# Patient Record
Sex: Female | Born: 1955 | Race: White | Hispanic: No | Marital: Married | Smoking: Current every day smoker
Health system: Southern US, Community
[De-identification: ages and names within clinical notes are randomized; demographics above are authoritative.]

## PROBLEM LIST (undated history)

## (undated) DIAGNOSIS — Z950 Presence of cardiac pacemaker: Secondary | ICD-10-CM

## (undated) DIAGNOSIS — E079 Disorder of thyroid, unspecified: Secondary | ICD-10-CM

## (undated) DIAGNOSIS — M329 Systemic lupus erythematosus, unspecified: Secondary | ICD-10-CM

## (undated) DIAGNOSIS — M797 Fibromyalgia: Secondary | ICD-10-CM

## (undated) DIAGNOSIS — S72009A Fracture of unspecified part of neck of unspecified femur, initial encounter for closed fracture: Secondary | ICD-10-CM

## (undated) DIAGNOSIS — F0390 Unspecified dementia without behavioral disturbance: Secondary | ICD-10-CM

## (undated) DIAGNOSIS — I2510 Atherosclerotic heart disease of native coronary artery without angina pectoris: Secondary | ICD-10-CM

## (undated) DIAGNOSIS — N289 Disorder of kidney and ureter, unspecified: Secondary | ICD-10-CM

## (undated) DIAGNOSIS — E119 Type 2 diabetes mellitus without complications: Secondary | ICD-10-CM

## (undated) DIAGNOSIS — J449 Chronic obstructive pulmonary disease, unspecified: Secondary | ICD-10-CM

## (undated) DIAGNOSIS — I509 Heart failure, unspecified: Secondary | ICD-10-CM

## (undated) DIAGNOSIS — I639 Cerebral infarction, unspecified: Secondary | ICD-10-CM

## (undated) DIAGNOSIS — IMO0002 Reserved for concepts with insufficient information to code with codable children: Secondary | ICD-10-CM

## (undated) DIAGNOSIS — I10 Essential (primary) hypertension: Secondary | ICD-10-CM

## (undated) HISTORY — PX: CHOLECYSTECTOMY: SHX55

## (undated) HISTORY — PX: CARDIAC DEFIBRILLATOR PLACEMENT: SHX171

## (undated) HISTORY — PX: SHOULDER SURGERY: SHX246

## (undated) HISTORY — PX: CORONARY ANGIOPLASTY WITH STENT PLACEMENT: SHX49

## (undated) HISTORY — PX: APPENDECTOMY: SHX54

## (undated) HISTORY — PX: ICD GENERATOR CHANGE: SHX5854

## (undated) HISTORY — PX: ABDOMINAL HYSTERECTOMY: SHX81

---

## 2000-05-13 ENCOUNTER — Ambulatory Visit (HOSPITAL_BASED_OUTPATIENT_CLINIC_OR_DEPARTMENT_OTHER): Admission: RE | Admit: 2000-05-13 | Discharge: 2000-05-13 | Payer: Self-pay | Admitting: Otolaryngology

## 2013-12-03 ENCOUNTER — Other Ambulatory Visit (HOSPITAL_BASED_OUTPATIENT_CLINIC_OR_DEPARTMENT_OTHER): Payer: Self-pay | Admitting: Podiatry

## 2013-12-03 DIAGNOSIS — M25472 Effusion, left ankle: Secondary | ICD-10-CM

## 2013-12-03 DIAGNOSIS — M779 Enthesopathy, unspecified: Secondary | ICD-10-CM

## 2013-12-03 DIAGNOSIS — R52 Pain, unspecified: Secondary | ICD-10-CM

## 2013-12-03 DIAGNOSIS — M7672 Peroneal tendinitis, left leg: Secondary | ICD-10-CM

## 2013-12-04 ENCOUNTER — Other Ambulatory Visit (HOSPITAL_BASED_OUTPATIENT_CLINIC_OR_DEPARTMENT_OTHER): Payer: Self-pay

## 2013-12-04 ENCOUNTER — Ambulatory Visit (HOSPITAL_BASED_OUTPATIENT_CLINIC_OR_DEPARTMENT_OTHER): Payer: Medicare Other

## 2013-12-07 ENCOUNTER — Other Ambulatory Visit (HOSPITAL_BASED_OUTPATIENT_CLINIC_OR_DEPARTMENT_OTHER): Payer: Self-pay | Admitting: Podiatry

## 2013-12-07 ENCOUNTER — Ambulatory Visit (HOSPITAL_BASED_OUTPATIENT_CLINIC_OR_DEPARTMENT_OTHER)
Admission: RE | Admit: 2013-12-07 | Discharge: 2013-12-07 | Disposition: A | Discharge status: Home / Self Care | Payer: Medicare Other | Source: Ambulatory Visit | Attending: Podiatry | Admitting: Podiatry

## 2013-12-07 DIAGNOSIS — M7730 Calcaneal spur, unspecified foot: Secondary | ICD-10-CM | POA: Insufficient documentation

## 2013-12-07 DIAGNOSIS — M8430XA Stress fracture, unspecified site, initial encounter for fracture: Secondary | ICD-10-CM | POA: Insufficient documentation

## 2016-01-15 ENCOUNTER — Emergency Department (HOSPITAL_BASED_OUTPATIENT_CLINIC_OR_DEPARTMENT_OTHER): Payer: Medicare Other

## 2016-01-15 ENCOUNTER — Encounter (HOSPITAL_BASED_OUTPATIENT_CLINIC_OR_DEPARTMENT_OTHER): Payer: Self-pay | Admitting: Emergency Medicine

## 2016-01-15 DIAGNOSIS — Z8673 Personal history of transient ischemic attack (TIA), and cerebral infarction without residual deficits: Secondary | ICD-10-CM | POA: Insufficient documentation

## 2016-01-15 DIAGNOSIS — I509 Heart failure, unspecified: Secondary | ICD-10-CM | POA: Diagnosis not present

## 2016-01-15 DIAGNOSIS — Z7982 Long term (current) use of aspirin: Secondary | ICD-10-CM | POA: Diagnosis not present

## 2016-01-15 DIAGNOSIS — Z7951 Long term (current) use of inhaled steroids: Secondary | ICD-10-CM | POA: Diagnosis not present

## 2016-01-15 DIAGNOSIS — S61214A Laceration without foreign body of right ring finger without damage to nail, initial encounter: Secondary | ICD-10-CM | POA: Insufficient documentation

## 2016-01-15 DIAGNOSIS — Y999 Unspecified external cause status: Secondary | ICD-10-CM | POA: Insufficient documentation

## 2016-01-15 DIAGNOSIS — Y929 Unspecified place or not applicable: Secondary | ICD-10-CM | POA: Diagnosis not present

## 2016-01-15 DIAGNOSIS — J449 Chronic obstructive pulmonary disease, unspecified: Secondary | ICD-10-CM | POA: Insufficient documentation

## 2016-01-15 DIAGNOSIS — W268XXA Contact with other sharp object(s), not elsewhere classified, initial encounter: Secondary | ICD-10-CM | POA: Diagnosis not present

## 2016-01-15 DIAGNOSIS — Z79899 Other long term (current) drug therapy: Secondary | ICD-10-CM | POA: Diagnosis not present

## 2016-01-15 DIAGNOSIS — I110 Hypertensive heart disease with heart failure: Secondary | ICD-10-CM | POA: Diagnosis not present

## 2016-01-15 DIAGNOSIS — Y939 Activity, unspecified: Secondary | ICD-10-CM | POA: Diagnosis not present

## 2016-01-15 DIAGNOSIS — I2510 Atherosclerotic heart disease of native coronary artery without angina pectoris: Secondary | ICD-10-CM | POA: Diagnosis not present

## 2016-01-15 DIAGNOSIS — F17200 Nicotine dependence, unspecified, uncomplicated: Secondary | ICD-10-CM | POA: Insufficient documentation

## 2016-01-15 NOTE — ED Triage Notes (Signed)
Patient states that she cut her right ring finger  - patient has laceration   To the right finger. Bleeding controlled

## 2016-01-16 ENCOUNTER — Encounter (HOSPITAL_BASED_OUTPATIENT_CLINIC_OR_DEPARTMENT_OTHER): Payer: Self-pay | Admitting: Emergency Medicine

## 2016-01-16 ENCOUNTER — Emergency Department (HOSPITAL_BASED_OUTPATIENT_CLINIC_OR_DEPARTMENT_OTHER)
Admission: EM | Admit: 2016-01-16 | Discharge: 2016-01-16 | Disposition: A | Discharge status: Home / Self Care | Payer: Medicare Other | Attending: Emergency Medicine | Admitting: Emergency Medicine

## 2016-01-16 DIAGNOSIS — S61214A Laceration without foreign body of right ring finger without damage to nail, initial encounter: Secondary | ICD-10-CM | POA: Diagnosis not present

## 2016-01-16 DIAGNOSIS — S61219A Laceration without foreign body of unspecified finger without damage to nail, initial encounter: Secondary | ICD-10-CM

## 2016-01-16 HISTORY — DX: Type 2 diabetes mellitus without complications: E11.9

## 2016-01-16 HISTORY — DX: Fibromyalgia: M79.7

## 2016-01-16 HISTORY — DX: Presence of cardiac pacemaker: Z95.0

## 2016-01-16 HISTORY — DX: Cerebral infarction, unspecified: I63.9

## 2016-01-16 HISTORY — DX: Disorder of thyroid, unspecified: E07.9

## 2016-01-16 HISTORY — DX: Reserved for concepts with insufficient information to code with codable children: IMO0002

## 2016-01-16 HISTORY — DX: Fracture of unspecified part of neck of unspecified femur, initial encounter for closed fracture: S72.009A

## 2016-01-16 HISTORY — DX: Essential (primary) hypertension: I10

## 2016-01-16 HISTORY — DX: Heart failure, unspecified: I50.9

## 2016-01-16 HISTORY — DX: Disorder of kidney and ureter, unspecified: N28.9

## 2016-01-16 HISTORY — DX: Systemic lupus erythematosus, unspecified: M32.9

## 2016-01-16 HISTORY — DX: Chronic obstructive pulmonary disease, unspecified: J44.9

## 2016-01-16 HISTORY — DX: Unspecified dementia, unspecified severity, without behavioral disturbance, psychotic disturbance, mood disturbance, and anxiety: F03.90

## 2016-01-16 HISTORY — DX: Atherosclerotic heart disease of native coronary artery without angina pectoris: I25.10

## 2016-01-16 MED ORDER — CIPROFLOXACIN HCL 500 MG PO TABS
500.0000 mg | ORAL_TABLET | Freq: Once | ORAL | Status: AC
  Administered 2016-01-16: 500 mg via ORAL
  Filled 2016-01-16: qty 1

## 2016-01-16 MED ORDER — LIDOCAINE VISCOUS 2 % MT SOLN: 15.0000 mL | Freq: Once | OROMUCOSAL | Status: DC

## 2016-01-16 MED ORDER — LIDOCAINE HCL 2 % IJ SOLN
10.0000 mL | Freq: Once | INTRAMUSCULAR | Status: AC
  Administered 2016-01-16: 400 mg via INTRADERMAL
  Filled 2016-01-16: qty 20

## 2016-01-16 MED ORDER — CIPROFLOXACIN HCL 500 MG PO TABS: 500.0000 mg | ORAL_TABLET | Freq: Two times a day (BID) | ORAL | 0 refills | Status: AC

## 2016-01-16 NOTE — ED Notes (Signed)
Cut rt ring finger on blass  Bleeding controlled,  Pt states unable to straighten  finger

## 2016-01-16 NOTE — ED Provider Notes (Addendum)
[facility]   CSN: [id number] Arrival date & time: 01/15/16  2254  First Provider Contact:  First MD Initiated Contact with Patient 01/16/16 0217        History   Chief Complaint Chief Complaint  Patient presents with  . Laceration    HPI Abigail Pearson is a 60 y.o. female.  The history is provided by the patient and a relative.  Laceration   The incident occurred 3 to 5 hours ago. The laceration is located on the right hand. Size: 1.3 cm. The laceration mechanism was a a metal edge. The pain is mild. The pain has been constant since onset. She reports no foreign bodies present. Her tetanus status is UTD.  jJammed finger in a glass and cut finger on brillo pad.    Past Medical History:  Diagnosis Date  . CHF (congestive heart failure) (HCC)   . COPD (chronic obstructive pulmonary disease) (HCC)   . Coronary artery disease   . Dementia   . Diabetes mellitus without complication (HCC)   . Fibromyalgia   . Hip fracture (HCC)   . Hypertension   . Lupus (HCC)   . Pacemaker   . Renal disorder   . Stroke (HCC)   . Thyroid disease     There are no active problems to display for this patient.   Past Surgical History:  Procedure Laterality Date  . ABDOMINAL HYSTERECTOMY    . APPENDECTOMY    . CARDIAC DEFIBRILLATOR PLACEMENT    . CHOLECYSTECTOMY    . CORONARY ANGIOPLASTY WITH STENT PLACEMENT    . ICD GENERATOR CHANGE    . SHOULDER SURGERY      OB History    No data available       Home Medications    Prior to Admission medications   Medication Sig Start Date End Date Taking? Authorizing Provider  albuterol (PROVENTIL HFA;VENTOLIN HFA) 108 (90 Base) MCG/ACT inhaler Inhale into the lungs every 6 (six) hours as needed for wheezing or shortness of breath.   Yes Historical Provider, MD  ALPRAZolam Prudy Feeler) 0.5 MG tablet Take 0.5 mg by mouth 3 (three) times daily.   Yes Historical Provider, MD  amphetamine-dextroamphetamine (ADDERALL) 10 MG  tablet Take 10 mg by mouth daily with breakfast.   Yes Historical Provider, MD  aspirin 325 MG tablet Take 325 mg by mouth daily.   Yes Historical Provider, MD  eszopiclone (LUNESTA) 1 MG TABS tablet Take 1 mg by mouth at bedtime as needed for sleep. Take immediately before bedtime   Yes Historical Provider, MD  HYDROcodone-acetaminophen (NORCO/VICODIN) 5-325 MG tablet Take 2 tablets by mouth every 6 (six) hours as needed for moderate pain.   Yes Historical Provider, MD  isosorbide mononitrate (IMDUR) 120 MG 24 hr tablet Take 120 mg by mouth daily.   Yes Historical Provider, MD  levothyroxine (SYNTHROID, LEVOTHROID) 137 MCG tablet Take 137 mcg by mouth daily before breakfast.   Yes Historical Provider, MD  lisinopril (PRINIVIL,ZESTRIL) 20 MG tablet Take 20 mg by mouth daily.   Yes Historical Provider, MD  memantine (NAMENDA) 10 MG tablet Take 20 mg by mouth daily.   Yes Historical Provider, MD  metoprolol succinate (TOPROL-XL) 50 MG 24 hr tablet Take 50 mg by mouth daily. Take with or immediately following a meal.   Yes Historical Provider, MD  mirtazapine (REMERON) 30 MG tablet Take 30 mg by mouth at bedtime.   Yes Historical Provider, MD  oxycodone (OXY-IR) 5 MG capsule Take  10 mg by mouth every 4 (four) hours as needed.   Yes Historical Provider, MD  QUEtiapine (SEROQUEL) 50 MG tablet Take 50 mg by mouth at bedtime.   Yes Historical Provider, MD    Family History History reviewed. No pertinent family history.  Social History Social History  Substance Use Topics  . Smoking status: Current Every Day Smoker  . Smokeless tobacco: Never Used  . Alcohol use No     Allergies   Lithium   Review of Systems Review of Systems  Musculoskeletal: Positive for arthralgias.  Skin: Positive for wound.  All other systems reviewed and are negative.    Physical Exam Updated Vital Signs BP 147/55 (BP Location: Left Arm)   Pulse 84   Temp 97.7 F (36.5 C) (Oral)   Resp 16   Ht  (1.702  m)   Wt 124 lb (56.2 kg)   SpO2 100%   BMI 19.42 kg/m   Physical Exam  Constitutional: She appears well-developed and well-nourished. No distress.  HENT:  Head: Normocephalic and atraumatic.  Mouth/Throat: No oropharyngeal exudate.  Eyes: EOM are normal. Pupils are equal, round, and reactive to light.  Neck: Normal range of motion. Neck supple.  Cardiovascular: Normal rate, regular rhythm and intact distal pulses.   Pulmonary/Chest: Effort normal and breath sounds normal.  Abdominal: Soft. Bowel sounds are normal. There is no tenderness.  Musculoskeletal: Normal range of motion.  Skin: Skin is warm. Capillary refill takes less than 2 seconds.     Psychiatric: She has a normal mood and affect.     ED Treatments / Results  Labs (all labs ordered are listed, but only abnormal results are displayed) Labs Reviewed - No data to display  EKG  EKG Interpretation None       Radiology Dg Finger Ring Right  Result Date: 01/16/2016 CLINICAL DATA:  Cut ring finger 2 hours ago at DIP. EXAM: RIGHT RING FINGER 2+V COMPARISON:  None. FINDINGS: There is no evidence of fracture or dislocation. Persistently flexed fourth DIP. There is no evidence of arthropathy or other focal bone abnormality. Soft tissues are normal; bandage about the proximal fourth finger. IMPRESSION: No acute fracture deformity dislocation. Flexed fourth DIP joint associated with ligamentous injury. Electronically Signed   By: Awilda Metro M.D.   On: 01/16/2016 00:30    Procedures .Marland KitchenLaceration Repair Date/Time: 01/16/2016 3:40 AM Performed by: Cy Blamer Authorized by: Cy Blamer   Consent:    Consent obtained:  Verbal   Consent given by:  Patient   Risks discussed:  Infection, pain and poor wound healing Anesthesia (see MAR for exact dosages):    Anesthesia method:  Nerve block   Block needle gauge:  25 G   Block anesthetic:  Lidocaine 2% w/o epi   Block injection procedure:  Anatomic landmarks  identified and incremental injection   Block outcome:  Anesthesia achieved Laceration details:    Location: finger ring right.   Length (cm):  1.3   Depth (mm):  1 Repair type:    Repair type:  Simple Pre-procedure details:    Preparation:  Patient was prepped and draped in usual sterile fashion Exploration:    Wound exploration: wound explored through full range of motion     Wound extent: no nerve damage noted, no tendon damage noted and no underlying fracture noted     Contaminated: no   Treatment:    Area cleansed with:  Betadine and saline   Amount of cleaning:  Extensive  Irrigation solution:  Sterile saline Skin repair:    Repair method:  Sutures   Suture size:  4-0   Suture material:  Nylon   Suture technique:  Simple interrupted   Number of sutures:  3 Approximation:    Approximation:  Close   Vermilion border: well-aligned   Post-procedure details:    Dressing:  Bulky dressing and sterile dressing   Patient tolerance of procedure:  Tolerated well, no immediate complications Comments:     Removal in 8 days, cipro as is a diabetic   (including critical care time)  Medications Ordered in ED Medications  lidocaine (XYLOCAINE) 2 % (with pres) injection 200 mg (400 mg Intradermal Given 01/16/16 0303)     Initial Impression / Assessment and Plan / ED Course  I have reviewed the triage vital signs and the nursing notes.  Pertinent labs & imaging results that were available during my care of the patient were reviewed by me and considered in my medical decision making (see chart for details).  Clinical Course    No results found for this or any previous visit. Dg Finger Ring Right  Result Date: 01/16/2016 CLINICAL DATA:  Cut ring finger 2 hours ago at DIP. EXAM: RIGHT RING FINGER 2+V COMPARISON:  None. FINDINGS: There is no evidence of fracture or dislocation. Persistently flexed fourth DIP. There is no evidence of arthropathy or other focal bone abnormality. Soft  tissues are normal; bandage about the proximal fourth finger. IMPRESSION: No acute fracture deformity dislocation. Flexed fourth DIP joint associated with ligamentous injury. Electronically Signed   By: Awilda Metroourtnay  Bloomer M.D.   On: 01/16/2016 00:30     Final Clinical Impressions(s) / ED Diagnoses   Final diagnoses:  None   Vitals:   01/15/16 2314 01/16/16 0145  BP: 143/69 147/55  Pulse: 94 84  Resp: 16 16  Temp: 97.7 F (36.5 C)    Medications  lidocaine (XYLOCAINE) 2 % (with pres) injection 200 mg (400 mg Intradermal Given 01/16/16 0303)  ciprofloxacin (CIPRO) tablet 500 mg (500 mg Oral Given 01/16/16 0402)   Have splinted finger.  Reduced x 5 and finger straightens and then DIP flexes again.  Follow up with hand surgery.  Leave in splint.  Suture removal at urgent care in 8 days.  Abx given as patient is a diabetic. All questions answered to patient's satisfaction. Based on history and exam patient has been appropriately medically screened and emergency conditions excluded. Patient is stable for discharge at this time. Follow up with your PMDfor recheck in 2 daysand strict return precautions given. New Prescriptions New Prescriptions   No medications on file     Izadora Roehr, MD 01/16/16 [id number]    Niamh Rada, MD 01/16/16 0750    Zameer Borman, MD 01/16/16 [phone]

## 2017-04-03 IMAGING — CR DG FINGER RING 2+V*R*
3 series · 3 of 3 positions shown · non-contrast
Comparison: None.

CLINICAL DATA: Cut ring finger 2 hours ago at DIP.

EXAM:
RIGHT RING FINGER 2+V

[x finger pa right]
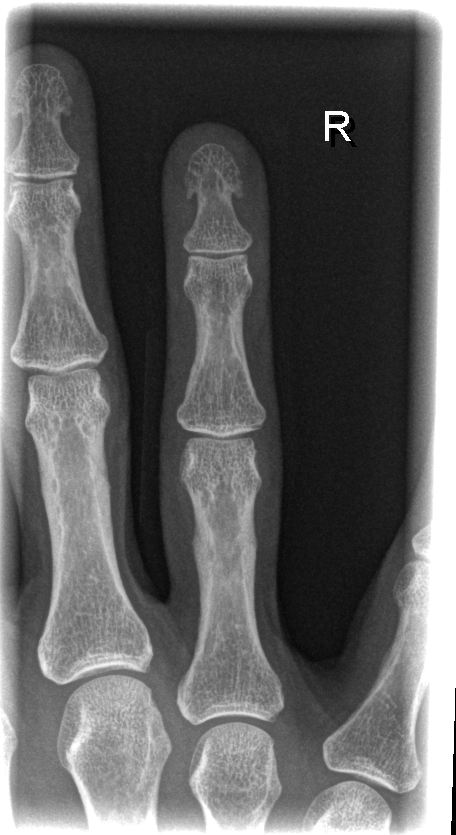

[x finger obl. right]
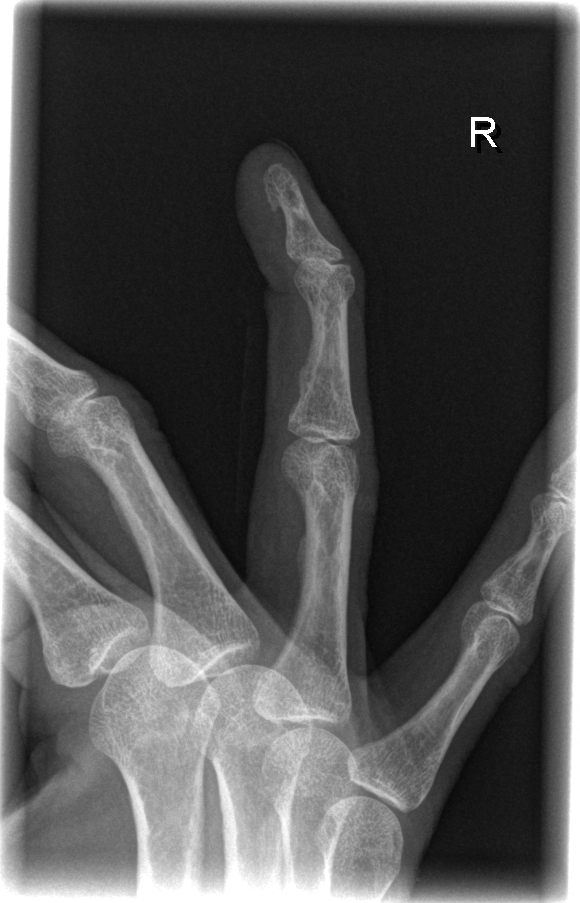

[x finger lateral right]
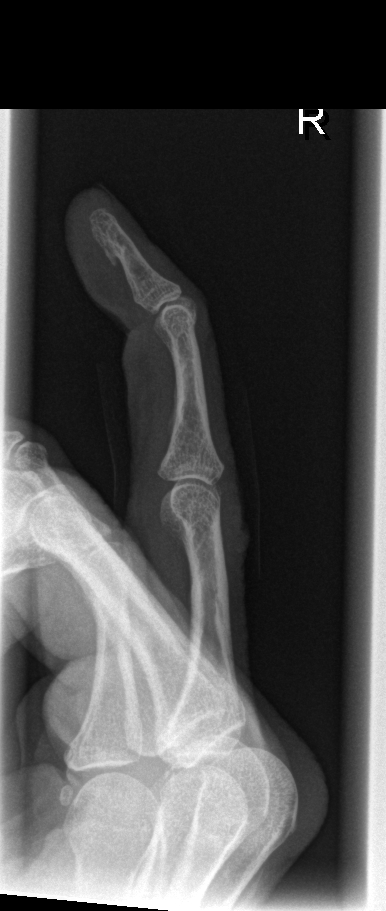

[3 of 3 positions shown; findings below may reference images not displayed]

FINDINGS: There is no evidence of fracture or dislocation. Persistently flexed
fourth DIP. There is no evidence of arthropathy or other focal bone
abnormality. Soft tissues are normal; bandage about the proximal
fourth finger.
IMPRESSION: No acute fracture deformity dislocation.

Flexed fourth DIP joint associated with ligamentous injury.

## 2021-11-15 ENCOUNTER — Encounter (HOSPITAL_BASED_OUTPATIENT_CLINIC_OR_DEPARTMENT_OTHER): Payer: Self-pay

## 2021-11-15 ENCOUNTER — Emergency Department (HOSPITAL_BASED_OUTPATIENT_CLINIC_OR_DEPARTMENT_OTHER): Payer: Medicare HMO

## 2021-11-15 ENCOUNTER — Other Ambulatory Visit: Payer: Self-pay

## 2021-11-15 ENCOUNTER — Emergency Department (HOSPITAL_BASED_OUTPATIENT_CLINIC_OR_DEPARTMENT_OTHER)
Admission: EM | Admit: 2021-11-15 | Discharge: 2021-11-15 | Disposition: A | Discharge status: Home / Self Care | Payer: Medicare HMO | Attending: Emergency Medicine | Admitting: Emergency Medicine

## 2021-11-15 DIAGNOSIS — E119 Type 2 diabetes mellitus without complications: Secondary | ICD-10-CM | POA: Diagnosis not present

## 2021-11-15 DIAGNOSIS — Z79899 Other long term (current) drug therapy: Secondary | ICD-10-CM | POA: Insufficient documentation

## 2021-11-15 DIAGNOSIS — Z7951 Long term (current) use of inhaled steroids: Secondary | ICD-10-CM | POA: Diagnosis not present

## 2021-11-15 DIAGNOSIS — R1084 Generalized abdominal pain: Secondary | ICD-10-CM | POA: Diagnosis not present

## 2021-11-15 DIAGNOSIS — I110 Hypertensive heart disease with heart failure: Secondary | ICD-10-CM | POA: Diagnosis not present

## 2021-11-15 DIAGNOSIS — F0390 Unspecified dementia without behavioral disturbance: Secondary | ICD-10-CM | POA: Insufficient documentation

## 2021-11-15 DIAGNOSIS — J449 Chronic obstructive pulmonary disease, unspecified: Secondary | ICD-10-CM | POA: Insufficient documentation

## 2021-11-15 DIAGNOSIS — R197 Diarrhea, unspecified: Secondary | ICD-10-CM | POA: Insufficient documentation

## 2021-11-15 DIAGNOSIS — Z7982 Long term (current) use of aspirin: Secondary | ICD-10-CM | POA: Diagnosis not present

## 2021-11-15 DIAGNOSIS — I509 Heart failure, unspecified: Secondary | ICD-10-CM | POA: Insufficient documentation

## 2021-11-15 LAB — CBC WITH DIFFERENTIAL/PLATELET
Abs Immature Granulocytes: 0.02 10*3/uL (ref 0.00–0.07)
Basophils Absolute: 0.1 10*3/uL (ref 0.0–0.1)
Basophils Relative: 1 %
Eosinophils Absolute: 0.2 10*3/uL (ref 0.0–0.5)
Eosinophils Relative: 4 %
HCT: 32.9 % — ABNORMAL LOW (ref 36.0–46.0)
Hemoglobin: 11.1 g/dL — ABNORMAL LOW (ref 12.0–15.0)
Immature Granulocytes: 0 %
Lymphocytes Relative: 26 %
Lymphs Abs: 1.4 10*3/uL (ref 0.7–4.0)
MCH: 29.9 pg (ref 26.0–34.0)
MCHC: 33.7 g/dL (ref 30.0–36.0)
MCV: 88.7 fL (ref 80.0–100.0)
Monocytes Absolute: 0.3 10*3/uL (ref 0.1–1.0)
Monocytes Relative: 6 %
Neutro Abs: 3.4 10*3/uL (ref 1.7–7.7)
Neutrophils Relative %: 63 %
Platelets: 290 10*3/uL (ref 150–400)
RBC: 3.71 MIL/uL — ABNORMAL LOW (ref 3.87–5.11)
RDW: 14.6 % (ref 11.5–15.5)
WBC: 5.3 10*3/uL (ref 4.0–10.5)
nRBC: 0 % (ref 0.0–0.2)

## 2021-11-15 LAB — LIPASE, BLOOD: Lipase: 67 U/L — ABNORMAL HIGH (ref 11–51)

## 2021-11-15 LAB — COMPREHENSIVE METABOLIC PANEL
ALT: 9 U/L (ref 0–44)
AST: 14 U/L — ABNORMAL LOW (ref 15–41)
Albumin: 3.6 g/dL (ref 3.5–5.0)
Alkaline Phosphatase: 89 U/L (ref 38–126)
Anion gap: 7 (ref 5–15)
BUN: 20 mg/dL (ref 8–23)
CO2: 24 mmol/L (ref 22–32)
Calcium: 8.9 mg/dL (ref 8.9–10.3)
Chloride: 103 mmol/L (ref 98–111)
Creatinine, Ser: 1.32 mg/dL — ABNORMAL HIGH (ref 0.44–1.00)
GFR, Estimated: 44 mL/min — ABNORMAL LOW (ref >60)
Glucose, Bld: 197 mg/dL — ABNORMAL HIGH (ref 70–99)
Potassium: 3.6 mmol/L (ref 3.5–5.1)
Sodium: 134 mmol/L — ABNORMAL LOW (ref 135–145)
Total Bilirubin: 0.2 mg/dL — ABNORMAL LOW (ref 0.3–1.2)
Total Protein: 7.3 g/dL (ref 6.5–8.1)

## 2021-11-15 LAB — OCCULT BLOOD X 1 CARD TO LAB, STOOL: Fecal Occult Bld: NEGATIVE

## 2021-11-15 LAB — URINALYSIS, ROUTINE W REFLEX MICROSCOPIC
Bilirubin Urine: NEGATIVE
Glucose, UA: NEGATIVE mg/dL
Hgb urine dipstick: NEGATIVE
Ketones, ur: NEGATIVE mg/dL
Leukocytes,Ua: NEGATIVE
Nitrite: NEGATIVE
Protein, ur: NEGATIVE mg/dL
Specific Gravity, Urine: 1.025 (ref 1.005–1.030)
pH: 5.5 (ref 5.0–8.0)

## 2021-11-15 MED ORDER — IOHEXOL 350 MG/ML SOLN
80.0000 mL | Freq: Once | INTRAVENOUS | Status: AC | PRN
  Administered 2021-11-15: 80 mL via INTRAVENOUS

## 2021-11-15 MED ORDER — SODIUM CHLORIDE 0.9 % IV BOLUS
500.0000 mL | Freq: Once | INTRAVENOUS | Status: AC
  Administered 2021-11-15: 500 mL via INTRAVENOUS

## 2021-11-15 NOTE — ED Triage Notes (Signed)
Was seen by PMD and told to come to ED. Patient states she has had diarrhea x 16 days with vomiting. Patient states she has blood in her stool as well - Patient was diagnosed with ischemic colitis.

## 2021-11-15 NOTE — ED Provider Notes (Signed)
[facility] EMERGENCY DEPARTMENT Provider Note   CSN: [id number] Arrival date & time: 11/15/21  1449     History  Chief Complaint  Patient presents with   Diarrhea    Abigail Pearson is a 66 y.o. female with a medical history of Crohn's disease, hypertension, diabetes mellitus, CVA, COPD, CHF, cardiac defibrillator, lupus, fibromyalgia, dementia.   Presents to the emergency department with a chief complaint of diarrhea.  Patient reports that she has had diarrhea for last 16 days.  Patient states that she been having multiple episodes of diarrhea each of these days.  Patient attempted n.p.o. diet as well as a liquid diet with no improvement of her symptoms.  Patient has taken Imodium with no improvement in her symptoms.  Denies any recent antibiotic use, international travel, or camping.  No known sick contacts.  Patient also reports that she has been having intermittent blood in her stool over this time as well.  Describes blood as bright red in color.  Patient also has been having nausea and vomiting intermittently.  Last had episode of vomiting yesterday evening when she attempted to eat food.  Emesis described as stomach contents.  Patient also endorses constant abdominal pain over the last 2 days.  Abdominal pain is generalized throughout her entire abdomen.  States that "everything makes it worse."  Patient reports episode of melena 6 to 8 weeks prior.  No further episodes.  Denies any frequent NSAID use, illicit drug or alcohol use.  Endorses previous history of cesarean section.  Patient states that she has not seen a gastroenterologist in the last 20 years for her Crohn's disease as her previous provider retired.  Denies any fever, abdominal distention, constipation, dysuria, hematuria, urinary urgency, vaginal pain, vaginal bleeding, vaginal discharge, lightheadedness, syncope.   Diarrhea Associated symptoms: abdominal pain, chills and vomiting   Associated symptoms: no  fever and no headaches        Home Medications Prior to Admission medications   Medication Sig Start Date End Date Taking? Authorizing Provider  albuterol (PROVENTIL HFA;VENTOLIN HFA) 108 (90 Base) MCG/ACT inhaler Inhale into the lungs every 6 (six) hours as needed for wheezing or shortness of breath.    [provider]  ALPRAZolam Prudy Feeler) 0.5 MG tablet Take 0.5 mg by mouth 3 (three) times daily.    [provider]  amphetamine-dextroamphetamine (ADDERALL) 10 MG tablet Take 10 mg by mouth daily with breakfast.    [provider]  aspirin 325 MG tablet Take 325 mg by mouth daily.    [provider]  ciprofloxacin (CIPRO) 500 MG tablet Take 1 tablet (500 mg total) by mouth 2 (two) times daily. One po bid x 7 days 01/16/16   Palumbo, April, MD  eszopiclone (LUNESTA) 1 MG TABS tablet Take 1 mg by mouth at bedtime as needed for sleep. Take immediately before bedtime    [provider]  HYDROcodone-acetaminophen (NORCO/VICODIN) 5-325 MG tablet Take 2 tablets by mouth every 6 (six) hours as needed for moderate pain.    [provider]  isosorbide mononitrate (IMDUR) 120 MG 24 hr tablet Take 120 mg by mouth daily.    [provider]  levothyroxine (SYNTHROID, LEVOTHROID) 137 MCG tablet Take 137 mcg by mouth daily before breakfast.    [provider]  lisinopril (PRINIVIL,ZESTRIL) 20 MG tablet Take 20 mg by mouth daily.    [provider]  memantine (NAMENDA) 10 MG tablet Take 20 mg by mouth daily.    [provider]  metoprolol succinate (TOPROL-XL) 50 MG 24 hr tablet Take 50 mg by mouth daily. Take with or immediately following a meal.    [provider]  mirtazapine (REMERON) 30 MG tablet Take 30 mg by mouth at bedtime.    [provider]  oxycodone (OXY-IR) 5 MG capsule Take 10 mg by mouth every 4 (four) hours as needed.    [provider]  QUEtiapine (SEROQUEL) 50 MG tablet Take 50 mg  by mouth at bedtime.    [provider]      Allergies    Lithium    Review of Systems   Review of Systems  Constitutional:  Positive for chills. Negative for fever.  Eyes:  Negative for visual disturbance.  Respiratory:  Negative for shortness of breath.   Cardiovascular:  Negative for chest pain.  Gastrointestinal:  Positive for abdominal pain, blood in stool, diarrhea, nausea and vomiting. Negative for abdominal distention, anal bleeding, constipation and rectal pain.  Genitourinary:  Negative for difficulty urinating, dysuria, frequency, hematuria, pelvic pain, urgency, vaginal bleeding, vaginal discharge and vaginal pain.  Musculoskeletal:  Negative for back pain and neck pain.  Skin:  Negative for color change and rash.  Neurological:  Negative for dizziness, syncope, light-headedness and headaches.  Psychiatric/Behavioral:  Negative for confusion.     Physical Exam Updated Vital Signs BP (!) 165/75 (BP Location: Left Arm)   Pulse 68   Temp 98.3 F (36.8 C) (Oral)   Resp 18   Ht 5\' 7"  (1.702 m)   Wt 61.7 kg   SpO2 100%   BMI 21.30 kg/m  Physical Exam Vitals and nursing note reviewed. Chaperone present: female nurse tech present as chaperone.  Constitutional:      General: She is not in acute distress.    Appearance: She is not ill-appearing, toxic-appearing or diaphoretic.  HENT:     Head: Normocephalic.  Eyes:     General: No scleral icterus.       Right eye: No discharge.        Left eye: No discharge.  Cardiovascular:     Rate and Rhythm: Normal rate.  Pulmonary:     Effort: Pulmonary effort is normal.  Abdominal:     General: Abdomen is flat. Bowel sounds are normal. There is no distension. There are no signs of injury.     Palpations: Abdomen is soft. There is no mass or pulsatile mass.     Tenderness: There is generalized abdominal tenderness. There is no guarding or rebound.     Hernia: There is no hernia in the umbilical area or ventral area.      Comments: Generalized tenderness throughout abdomen.  Increased tenderness to suprapubic area  Genitourinary:    Rectum: No mass, tenderness, anal fissure, external hemorrhoid or internal hemorrhoid. Normal anal tone.     Comments: Light brown stool noted in rectal vault independence.  No frank red blood or melena noted Skin:    General: Skin is warm and dry.  Neurological:     General: No focal deficit present.     Mental Status: She is alert.  Psychiatric:        Behavior: Behavior is cooperative.     ED Results / Procedures / Treatments   Labs (all labs ordered are listed, but only abnormal results are displayed) Labs Reviewed  CBC WITH DIFFERENTIAL/PLATELET - Abnormal; Notable for the following components:      Result Value   RBC 3.71 (*)  Hemoglobin 11.1 (*)    HCT 32.9 (*)    All other components within normal limits  COMPREHENSIVE METABOLIC PANEL - Abnormal; Notable for the following components:   Sodium 134 (*)    Glucose, Bld 197 (*)    Creatinine, Ser 1.32 (*)    AST 14 (*)    Total Bilirubin 0.2 (*)    GFR, Estimated 44 (*)    All other components within normal limits  LIPASE, BLOOD - Abnormal; Notable for the following components:   Lipase 67 (*)    All other components within normal limits  URINE CULTURE  C DIFFICILE QUICK SCREEN W PCR REFLEX    GASTROINTESTINAL PANEL BY PCR, STOOL (REPLACES STOOL CULTURE)  URINALYSIS, ROUTINE W REFLEX MICROSCOPIC  OCCULT BLOOD X 1 CARD TO LAB, STOOL    EKG None  Radiology CT Angio Abd/Pel W and/or Wo Contrast  Result Date: 11/15/2021 CLINICAL DATA:  66 year old with mesenteric ischemia. Acute GI bleed. EXAM: CTA ABDOMEN AND PELVIS WITHOUT AND WITH CONTRAST TECHNIQUE: Multidetector CT imaging of the abdomen and pelvis was performed using the standard protocol during bolus administration of intravenous contrast. Multiplanar reconstructed images and MIPs were obtained and reviewed to evaluate the vascular anatomy.  RADIATION DOSE REDUCTION: This exam was performed according to the departmental dose-optimization program which includes automated exposure control, adjustment of the mA and/or kV according to patient size and/or use of iterative reconstruction technique. CONTRAST:  80mL OMNIPAQUE IOHEXOL 350 MG/ML SOLN COMPARISON:  CT abdomen pelvis 10/24/2020 FINDINGS: VASCULAR Aorta: Calcified plaque in the abdominal aorta without aneurysm, dissection or significant stenosis. Celiac: Patent without evidence of aneurysm, dissection, vasculitis or significant stenosis. SMA: Mild stenosis at the origin of the SMA. No evidence for aneurysm or dissection. Renals: Atherosclerotic plaque and likely hemodynamically significant stenosis at the origin of the right renal artery. At least mild stenosis at the origin of the left renal artery. No definite renal artery aneurysms. IMA: Small but patent. Inflow: Diffuse atherosclerotic calcifications in the iliac arteries. Common and external iliac arteries are patent without high-grade stenosis. Evidence for stenosis at the origin of the internal iliac arteries bilaterally. Proximal Outflow: Proximal femoral arteries are patent bilaterally. Veins: Portal venous system is patent. Renal veins are patent. Limited evaluation of the iliac veins. Review of the MIP images confirms the above findings. NON-VASCULAR Lower chest: Calcified granuloma at the left lung base measures 6 mm. No pleural effusions. Stable punctate peripheral nodule in the right lower lobe on sequence 4 image 18. Cardiac ICD. Hepatobiliary: Cholecystectomy. No acute liver abnormality. No discrete liver lesion. Pancreas: Unremarkable. No pancreatic ductal dilatation or surrounding inflammatory changes. Spleen: Spleen is normal in size.  Multiple splenic calcifications. Adrenals/Urinary Tract: Normal adrenal glands. Bilateral renal cysts. The cysts do not require dedicated follow-up. Kidneys are slightly lobulated bilaterally. No  suspicious renal lesions. No hydronephrosis. Urinary bladder is decompressed. Stomach/Bowel: Large calcification near the distal esophagus. Normal appearance of the stomach. No bowel dilatation or obstruction. No focal bowel inflammation. No evidence for active GI bleeding. Lymphatic: No lymph node enlargement in the abdomen or pelvis. Reproductive: Status post hysterectomy. No adnexal masses. Other: Negative for ascites.  Negative for free air. Musculoskeletal: No acute bone abnormality. Old fracture involving the left inferior pubic ramus. Deformity along the L1 superior endplate is chronic. IMPRESSION: VASCULAR 1. Diffuse atherosclerotic disease in the abdominal aorta and iliac arteries. No evidence for an aortic aneurysm. 2. Main mesenteric arteries are patent with at least mild narrowing at the origin  of the SMA and the IMA is small for size. 3. Stenosis involving bilateral renal arteries, right side more severe than left. NON-VASCULAR 1. No acute abnormality in the abdomen or pelvis. Specifically, no evidence for active GI bleeding. No evidence for bowel inflammation. 2. Bilateral renal cysts. The cysts do not require dedicated follow-up. Electronically Signed   By: Richarda Overlie M.D.   On: 11/15/2021 17:43    Procedures Procedures    Medications Ordered in ED Medications  iohexol (OMNIPAQUE) 350 MG/ML injection 80 mL (80 mLs Intravenous Contrast Given 11/15/21 1656)  sodium chloride 0.9 % bolus 500 mL (500 mLs Intravenous New Bag/Given 11/15/21 1715)    ED Course/ Medical Decision Making/ A&P                           Medical Decision Making Amount and/or Complexity of Data Reviewed Labs: ordered. Radiology: ordered.  Risk Prescription drug management.   Alert 66 year old female in no acute distress, nontoxic-appearing.  Presents to the emergency department with a chief complaint of diarrhea, abdominal pain, and blood in stool.  Information obtained from patient.  I personally reviewed  patient's past medical records including previous provider notes, labs, and imaging.  Patient has medical history as outlined in HPI which complicates her care.  Per chart review patient is not on any blood thinners.  Patient had CT scan 10/2020 that showed no acute intra-abdominal abnormalities.  Status post appendectomy.  Status post hysterectomy.  The patient having concern for possible ischemic colitis in the setting of newly arthrosclerosis will obtain CTA of abdomen pelvis for further evaluation.  CT imaging will also help differentiate acute intra-abdominal/pelvic abnormality versus inflammatory bowel disease.  Will obtain basic lab work as well.  I personally viewed and interpreted patient's lab results.  Pertinent findings include -Creatinine 1.32 -Lipase 67 -CBC shows anemia with hemoglobin 11.1 -Fecal occult negative -UA unremarkable  I personally viewed and interpreted patient CT imaging.  Agree with radiology interpretation of: -No acute abnormality in the abdomen or pelvis. -Main mesenteric arteries are patent with at least mild narrowing at the origin of SMA IMA small for size. -Stenosis involving bilateral renal arteries, right side more severe than the left.  On serial reexamination abdomen remains soft and nondistended.  Patient has been unable to give [place] a stool sample.  Will attempt p.o. challenge and hold patient.  Try to obtain stool sample.  Patient able to tolerate p.o. intake.  Patient able to provide stool sample.  As patient has not had any recent antibiotics we will hold any treatment for C. difficile at this time.  Ambulatory referral placed to gastroenterology.  Discussed symptomatic treatment with probiotics and bland diet.  Patient was discussed with and evaluated by Dr. Silverio Lay.  Based on patient's chief complaint, I considered admission might be necessary, however after reassuring ED workup feel patient is reasonable for discharge.  Discussed results, findings,  treatment and follow up. Patient advised of return precautions. Patient verbalized understanding and agreed with plan.  Portions of this note were generated with Scientist, clinical (histocompatibility and immunogenetics). Dictation errors may occur despite best attempts at proofreading.          Final Clinical Impression(s) / ED Diagnoses Final diagnoses:  None    Rx / DC Orders ED Discharge Orders     None         Berneice Heinrich 11/15/21 1928    Charlynne Pander, MD 11/16/21 [phone]

## 2021-11-15 NOTE — Discharge Instructions (Addendum)
You came to the emergency department today to be evaluated for your diarrhea and abdominal pain.  Your CT scan did not show any acute abnormalities.  Lab results were reassuring.  I have placed a referral to [facility] gastroenterology.  If you do not hear from them in 3 business days please use the information on this paperwork to call and schedule follow-up appointment.  Get help right away if: Your pain does not go away as soon as your health care provider told you to expect. You cannot stop vomiting. Your pain is only in areas of the abdomen, such as the right side or the left lower portion of the abdomen. Pain on the right side could be caused by appendicitis. You have bloody or black stools, or stools that look like tar. You have severe pain, cramping, or bloating in your abdomen. You have signs of dehydration, such as: Dark urine, very little urine, or no urine. Cracked lips. Dry mouth. Sunken eyes. Sleepiness. Weakness. You have trouble breathing or chest pain.

## 2021-11-17 LAB — URINE CULTURE

## 2022-12-02 ENCOUNTER — Emergency Department (HOSPITAL_COMMUNITY)
Admission: EM | Admit: 2022-12-02 | Discharge: 2022-12-02 | Disposition: A | Discharge status: Home / Self Care | Payer: Medicare HMO | Attending: Emergency Medicine | Admitting: Emergency Medicine

## 2022-12-02 ENCOUNTER — Emergency Department (HOSPITAL_COMMUNITY): Payer: Medicare HMO

## 2022-12-02 ENCOUNTER — Encounter (HOSPITAL_COMMUNITY): Payer: Self-pay

## 2022-12-02 DIAGNOSIS — F0390 Unspecified dementia without behavioral disturbance: Secondary | ICD-10-CM | POA: Insufficient documentation

## 2022-12-02 DIAGNOSIS — E119 Type 2 diabetes mellitus without complications: Secondary | ICD-10-CM | POA: Diagnosis not present

## 2022-12-02 DIAGNOSIS — I2510 Atherosclerotic heart disease of native coronary artery without angina pectoris: Secondary | ICD-10-CM | POA: Diagnosis not present

## 2022-12-02 DIAGNOSIS — I10 Essential (primary) hypertension: Secondary | ICD-10-CM | POA: Insufficient documentation

## 2022-12-02 DIAGNOSIS — R410 Disorientation, unspecified: Secondary | ICD-10-CM | POA: Diagnosis not present

## 2022-12-02 DIAGNOSIS — R1010 Upper abdominal pain, unspecified: Secondary | ICD-10-CM | POA: Diagnosis not present

## 2022-12-02 DIAGNOSIS — R112 Nausea with vomiting, unspecified: Secondary | ICD-10-CM | POA: Insufficient documentation

## 2022-12-02 DIAGNOSIS — Z79899 Other long term (current) drug therapy: Secondary | ICD-10-CM | POA: Insufficient documentation

## 2022-12-02 DIAGNOSIS — Z950 Presence of cardiac pacemaker: Secondary | ICD-10-CM | POA: Diagnosis not present

## 2022-12-02 DIAGNOSIS — J449 Chronic obstructive pulmonary disease, unspecified: Secondary | ICD-10-CM | POA: Diagnosis not present

## 2022-12-02 DIAGNOSIS — R1111 Vomiting without nausea: Secondary | ICD-10-CM

## 2022-12-02 DIAGNOSIS — Z7982 Long term (current) use of aspirin: Secondary | ICD-10-CM | POA: Insufficient documentation

## 2022-12-02 LAB — I-STAT VENOUS BLOOD GAS, ED
Acid-Base Excess: 0 mmol/L (ref 0.0–2.0)
Bicarbonate: 20 mmol/L (ref 20.0–28.0)
Calcium, Ion: 0.97 mmol/L — ABNORMAL LOW (ref 1.15–1.40)
HCT: 39 % (ref 36.0–46.0)
Hemoglobin: 13.3 g/dL (ref 12.0–15.0)
O2 Saturation: 100 %
Potassium: 3 mmol/L — ABNORMAL LOW (ref 3.5–5.1)
Sodium: 130 mmol/L — ABNORMAL LOW (ref 135–145)
TCO2: 21 mmol/L — ABNORMAL LOW (ref 22–32)
pCO2, Ven: 20.9 mmHg — ABNORMAL LOW (ref 44–60)
pH, Ven: 7.589 — ABNORMAL HIGH (ref 7.25–7.43)
pO2, Ven: 189 mmHg — ABNORMAL HIGH (ref 32–45)

## 2022-12-02 LAB — CBG MONITORING, ED: Glucose-Capillary: 259 mg/dL — ABNORMAL HIGH (ref 70–99)

## 2022-12-02 LAB — AMMONIA: Ammonia: 18 umol/L (ref 9–35)

## 2022-12-02 LAB — COMPREHENSIVE METABOLIC PANEL
ALT: 10 U/L (ref 0–44)
AST: 12 U/L — ABNORMAL LOW (ref 15–41)
Albumin: 3.5 g/dL (ref 3.5–5.0)
Alkaline Phosphatase: 107 U/L (ref 38–126)
Anion gap: 18 — ABNORMAL HIGH (ref 5–15)
BUN: 14 mg/dL (ref 8–23)
CO2: 19 mmol/L — ABNORMAL LOW (ref 22–32)
Calcium: 9.2 mg/dL (ref 8.9–10.3)
Chloride: 94 mmol/L — ABNORMAL LOW (ref 98–111)
Creatinine, Ser: 1.21 mg/dL — ABNORMAL HIGH (ref 0.44–1.00)
GFR, Estimated: 49 mL/min — ABNORMAL LOW (ref >60)
Glucose, Bld: 338 mg/dL — ABNORMAL HIGH (ref 70–99)
Potassium: 3 mmol/L — ABNORMAL LOW (ref 3.5–5.1)
Sodium: 131 mmol/L — ABNORMAL LOW (ref 135–145)
Total Bilirubin: 0.7 mg/dL (ref 0.3–1.2)
Total Protein: 7.1 g/dL (ref 6.5–8.1)

## 2022-12-02 LAB — CBC WITH DIFFERENTIAL/PLATELET
Abs Immature Granulocytes: 0.21 10*3/uL — ABNORMAL HIGH (ref 0.00–0.07)
Basophils Absolute: 0.1 10*3/uL (ref 0.0–0.1)
Basophils Relative: 0 %
Eosinophils Absolute: 0 10*3/uL (ref 0.0–0.5)
Eosinophils Relative: 0 %
HCT: 36.6 % (ref 36.0–46.0)
Hemoglobin: 12.4 g/dL (ref 12.0–15.0)
Immature Granulocytes: 1 %
Lymphocytes Relative: 5 %
Lymphs Abs: 0.7 10*3/uL (ref 0.7–4.0)
MCH: 29.4 pg (ref 26.0–34.0)
MCHC: 33.9 g/dL (ref 30.0–36.0)
MCV: 86.7 fL (ref 80.0–100.0)
Monocytes Absolute: 0.3 10*3/uL (ref 0.1–1.0)
Monocytes Relative: 2 %
Neutro Abs: 14.1 10*3/uL — ABNORMAL HIGH (ref 1.7–7.7)
Neutrophils Relative %: 92 %
Platelets: 330 10*3/uL (ref 150–400)
RBC: 4.22 MIL/uL (ref 3.87–5.11)
RDW: 14 % (ref 11.5–15.5)
WBC: 15.5 10*3/uL — ABNORMAL HIGH (ref 4.0–10.5)
nRBC: 0 % (ref 0.0–0.2)

## 2022-12-02 LAB — ETHANOL: Alcohol, Ethyl (B): <10 mg/dL (ref <10)

## 2022-12-02 MED ORDER — IOHEXOL 350 MG/ML SOLN
75.0000 mL | Freq: Once | INTRAVENOUS | Status: AC | PRN
  Administered 2022-12-02: 75 mL via INTRAVENOUS

## 2022-12-02 MED ORDER — ONDANSETRON 4 MG PO TBDP: 4.0000 mg | ORAL_TABLET | Freq: Three times a day (TID) | ORAL | 0 refills | Status: AC | PRN

## 2022-12-02 MED ORDER — SODIUM CHLORIDE 0.9 % IV BOLUS
1000.0000 mL | Freq: Once | INTRAVENOUS | Status: AC
  Administered 2022-12-02: 1000 mL via INTRAVENOUS

## 2022-12-02 MED ORDER — LORAZEPAM 2 MG/ML IJ SOLN
1.0000 mg | Freq: Once | INTRAMUSCULAR | Status: AC
  Administered 2022-12-02: 1 mg via INTRAVENOUS
  Filled 2022-12-02: qty 1

## 2022-12-02 NOTE — ED Notes (Signed)
Pt has had no episodes of vomiting since arriving to treatment room.

## 2022-12-02 NOTE — ED Triage Notes (Addendum)
Pt came in via POV with friend d/t pt calling him this morning & telling him she was having blurred vision. Friend then noticed she was very delirious & could not hold water down d/t vomiting. Pt is altered while in triage, Alert to self, Disoriented to year, location & situation.

## 2022-12-02 NOTE — ED Provider Notes (Signed)
[place] EMERGENCY DEPARTMENT AT [facility] Provider Note   CSN: [id number] Arrival date & time: 12/02/22  1121     History  Chief Complaint  Patient presents with   Altered Mental Status    Abigail Pearson is a 67 y.o. female.  Patient is here with nausea and vomiting may be some increased confusion.  She has a history of dementia, high blood pressure, diabetes, stroke, COPD, CAD with pacemaker.  Overall she states that she has been having some nausea and vomiting episodes.  Some upper abdominal pain.  Denies any chest pain or shortness of breath.  She lives by herself.  She denies any alcohol or drug use.  She has not been able to hold down her medications.  Friend is with her at the bedside he states that she sometimes has confusion.  But most of his concern was of her nausea and vomiting.  Patient denies any pain with urination.  Denies any fever or chills.  The history is provided by the patient.       Home Medications Prior to Admission medications   Medication Sig Start Date End Date Taking? Authorizing Provider  ondansetron (ZOFRAN-ODT) 4 MG disintegrating tablet Take 1 tablet (4 mg total) by mouth every 8 (eight) hours as needed for up to 20 doses for nausea or vomiting. 12/02/22  Yes Doshia Dalia, DO  albuterol (PROVENTIL HFA;VENTOLIN HFA) 108 (90 Base) MCG/ACT inhaler Inhale into the lungs every 6 (six) hours as needed for wheezing or shortness of breath.    [provider]  ALPRAZolam Prudy Feeler) 0.5 MG tablet Take 0.5 mg by mouth 3 (three) times daily.    [provider]  amphetamine-dextroamphetamine (ADDERALL) 10 MG tablet Take 10 mg by mouth daily with breakfast.    [provider]  aspirin 325 MG tablet Take 325 mg by mouth daily.    [provider]  ciprofloxacin (CIPRO) 500 MG tablet Take 1 tablet (500 mg total) by mouth 2 (two) times daily. One po bid x 7 days 01/16/16   Palumbo, April, MD  eszopiclone (LUNESTA) 1 MG TABS  tablet Take 1 mg by mouth at bedtime as needed for sleep. Take immediately before bedtime    [provider]  HYDROcodone-acetaminophen (NORCO/VICODIN) 5-325 MG tablet Take 2 tablets by mouth every 6 (six) hours as needed for moderate pain.    [provider]  isosorbide mononitrate (IMDUR) 120 MG 24 hr tablet Take 120 mg by mouth daily.    [provider]  levothyroxine (SYNTHROID, LEVOTHROID) 137 MCG tablet Take 137 mcg by mouth daily before breakfast.    [provider]  lisinopril (PRINIVIL,ZESTRIL) 20 MG tablet Take 20 mg by mouth daily.    [provider]  memantine (NAMENDA) 10 MG tablet Take 20 mg by mouth daily.    [provider]  metoprolol succinate (TOPROL-XL) 50 MG 24 hr tablet Take 50 mg by mouth daily. Take with or immediately following a meal.    [provider]  mirtazapine (REMERON) 30 MG tablet Take 30 mg by mouth at bedtime.    [provider]  oxycodone (OXY-IR) 5 MG capsule Take 10 mg by mouth every 4 (four) hours as needed.    [provider]  QUEtiapine (SEROQUEL) 50 MG tablet Take 50 mg by mouth at bedtime.    [provider]      Allergies    Lithium    Review of Systems   Review of Systems  Physical Exam Updated Vital Signs BP (!) 182/78   Pulse (!) 104   Temp 99.2 F (37.3 C) (Axillary)   Resp 15   SpO2 100%  Physical Exam Vitals and nursing note reviewed.  Constitutional:      General: She is not in acute distress.    Appearance: She is well-developed.  HENT:     Head: Normocephalic and atraumatic.     Nose: Nose normal.     Mouth/Throat:     Mouth: Mucous membranes are moist.  Eyes:     Extraocular Movements: Extraocular movements intact.     Conjunctiva/sclera: Conjunctivae normal.     Pupils: Pupils are equal, round, and reactive to light.  Cardiovascular:     Rate and Rhythm: Normal rate and regular rhythm.     Pulses: Normal pulses.     Heart sounds:  Normal heart sounds. No murmur heard. Pulmonary:     Effort: Pulmonary effort is normal. No respiratory distress.     Breath sounds: Normal breath sounds.  Abdominal:     General: Abdomen is flat.     Palpations: Abdomen is soft.     Tenderness: There is abdominal tenderness.  Musculoskeletal:        General: No swelling.     Cervical back: Neck supple.  Skin:    General: Skin is warm and dry.     Capillary Refill: Capillary refill takes less than 2 seconds.  Neurological:     General: No focal deficit present.     Mental Status: She is alert and oriented to person, place, and time.     Cranial Nerves: No cranial nerve deficit.     Sensory: No sensory deficit.     Motor: No weakness.     Coordination: Coordination normal.     Comments: Patient can tell me her name and where she is and what year it is, she has 5+ out of 5 strength throughout, normal sensation, no drift, normal finger-nose-finger, normal speech  Psychiatric:        Mood and Affect: Mood normal.     ED Results / Procedures / Treatments   Labs (all labs ordered are listed, but only abnormal results are displayed) Labs Reviewed  CBC WITH DIFFERENTIAL/PLATELET - Abnormal; Notable for the following components:      Result Value   WBC 15.5 (*)    Neutro Abs 14.1 (*)    Abs Immature Granulocytes 0.21 (*)    All other components within normal limits  COMPREHENSIVE METABOLIC PANEL - Abnormal; Notable for the following components:   Sodium 131 (*)    Potassium 3.0 (*)    Chloride 94 (*)    CO2 19 (*)    Glucose, Bld 338 (*)    Creatinine, Ser 1.21 (*)    AST 12 (*)    GFR, Estimated 49 (*)    Anion gap 18 (*)    All other components within normal limits  CBG MONITORING, ED - Abnormal; Notable for the following components:   Glucose-Capillary 259 (*)    All other components within normal limits  I-STAT VENOUS BLOOD GAS, ED - Abnormal; Notable for the following components:   pH, Ven 7.589 (*)    pCO2, Ven 20.9  (*)    pO2, Ven 189 (*)    TCO2 21 (*)    Sodium 130 (*)    Potassium 3.0 (*)    Calcium, Ion 0.97 (*)    All other components within normal limits  AMMONIA  ETHANOL    EKG EKG Interpretation  Date/Time:  Monday December 02 2022 12:36:27 EDT Ventricular Rate:  89 PR Interval:  150 QRS Duration: 156 QT Interval:  469 QTC Calculation: 571 R Axis:   -81 Text Interpretation: Sinus rhythm Nonspecific IVCD with LAD LVH with secondary repolarization abnormality Confirmed by Virgina Norfolk ([id number]) on 12/02/2022 12:41:35 PM  Radiology CT ABDOMEN PELVIS W CONTRAST  Result Date: 12/02/2022 CLINICAL DATA:  Acute abdominal pain. EXAM: CT ABDOMEN AND PELVIS WITH CONTRAST TECHNIQUE: Multidetector CT imaging of the abdomen and pelvis was performed using the standard protocol following bolus administration of intravenous contrast. RADIATION DOSE REDUCTION: This exam was performed according to the departmental dose-optimization program which includes automated exposure control, adjustment of the mA and/or kV according to patient size and/or use of iterative reconstruction technique. CONTRAST:  75mL OMNIPAQUE IOHEXOL 350 MG/ML SOLN COMPARISON:  CT angiogram 11/15/2021. CT abdomen pelvis without contrast 10/24/2020 FINDINGS: Lower chest: Lung bases are grossly clear. No pleural effusion. Calcified nodule left lung base series 5, image 15 consistent with old granulomatous disease. There are some calcified lymph nodes as well. Defibrillator leads along the heart which is nonenlarged. Hepatobiliary: Focal fat deposition seen in the liver adjacent to the falciform ligament. No other space-occupying liver lesion. Patent portal vein. Previous cholecystectomy. Pancreas: Moderate atrophy of the pancreas. Spleen: Splenic granulomas are identified.  Spleen is nonenlarged. Adrenals/Urinary Tract: Adrenal glands are preserved. There is moderate atrophy of the kidneys. Multiple bilateral renal cystic foci are identified. Example  left kidney on series 8, image 11 measures 21 mm. Hounsfield unit of 7. Similar foci in the right kidney. Bosniak 1 lesions. No specific imaging follow-up. The kidneys have lobular contour. No collecting system dilatation. The ureters have normal course and caliber down to the bladder. Preserved contours of the urinary bladder. Stomach/Bowel: No oral contrast. Large bowel is relatively collapsed overall. Question small polyp in the rectum on series 3, image 69 measuring 6 mm. Few colonic diverticula. The stomach is distended with fluid. No obstruction. Small bowel is nondilated. Appendix is poorly seen in the right lower quadrant but no pericecal stranding or fluid. Vascular/Lymphatic: Moderate vascular calcifications. Multilevel areas of stenosis along the iliac vessels and other branch vessels. Normal caliber IVC. No specific abnormal lymph node enlargement identified in the abdomen and pelvis. Reproductive: Status post hysterectomy. No adnexal masses. Other: No abdominal wall hernia or abnormality. No abdominopelvic ascites. Musculoskeletal: Scattered degenerative changes of the spine and pelvis. There is slight compression of the L1 level. Chronic deformity of the left pubic bone. IMPRESSION: No bowel obstruction, free air or free fluid.  Colon is collapsed. Previous cholecystectomy. Question 6 mm rectal polyp. Please correlate for prior colon evaluation and history. Evidence of old granulomatous disease. Electronically Signed   By: Karen Kays M.D.   On: 12/02/2022 13:57   DG Chest 1 View  Result Date: 12/02/2022 CLINICAL DATA:  Altered mental status, nausea, vomiting EXAM: CHEST  1 VIEW COMPARISON:  12/30/2018 FINDINGS: Cardiac size is within normal limits. There are no signs of pulmonary edema more focal pulmonary consolidation. There is no pleural effusion or pneumothorax. Pacemaker/defibrillator battery is seen in left infraclavicular region. Biventricular pacer leads are noted in place. There are  calcific densities in the retrocardiac region, possibly granulomas. IMPRESSION: There are no signs of pulmonary edema or focal pulmonary consolidation. Electronically Signed   By: Ernie Avena M.D.   On: 12/02/2022 12:39   CT Head Wo Contrast  Result Date:  12/02/2022 CLINICAL DATA:  Provided history: Mental status change, unknown cause. EXAM: CT HEAD WITHOUT CONTRAST TECHNIQUE: Contiguous axial images were obtained from the base of the skull through the vertex without intravenous contrast. RADIATION DOSE REDUCTION: This exam was performed according to the departmental dose-optimization program which includes automated exposure control, adjustment of the mA and/or kV according to patient size and/or use of iterative reconstruction technique. COMPARISON:  Head CT 03/18/2020. FINDINGS: Brain: No age advanced or lobar predominant parenchymal atrophy. There is no acute intracranial hemorrhage. No demarcated cortical infarct. No extra-axial fluid collection. No evidence of an intracranial mass. No midline shift. Vascular: No hyperdense vessel.  Atherosclerotic calcifications. Skull: No fracture or aggressive osseous lesion. Sinuses/Orbits: No mass or acute finding within the imaged orbits. No significant paranasal sinus disease at the imaged levels. Other: Left mastoid effusion. IMPRESSION: 1.  No evidence of an acute intracranial abnormality. 2. Left mastoid effusion. Electronically Signed   By: Jackey Loge D.O.   On: 12/02/2022 12:26    Procedures Procedures    Medications Ordered in ED Medications  LORazepam (ATIVAN) injection 1 mg (1 mg Intravenous Given 12/02/22 1351)  sodium chloride 0.9 % bolus 1,000 mL (0 mLs Intravenous Stopped 12/02/22 1451)  iohexol (OMNIPAQUE) 350 MG/ML injection 75 mL (75 mLs Intravenous Contrast Given 12/02/22 1344)    ED Course/ Medical Decision Making/ A&P                             Medical Decision Making Amount and/or Complexity of Data Reviewed Radiology:  ordered.  Risk Prescription drug management.   Stephene Alegria is here with nausea vomiting, some confusion.  History of dementia, fibromyalgia, diabetes.  Patient with unremarkable vitals except for hypertension.  Overall well-appearing.  Her friend is with her and overall she appears to be at her baseline.  Neurologically she is intact.  She does ramble a little bit but she does make sense.  She knows where she has and what her name is.  She has no fever.  Her main concern is some nausea and vomiting today.  Symptoms improved.  Lab works already been done.  pH of 7.5.  Bicarb 19, blood sugars 238.  Not consistent with DKA.  She is given IV fluids, IV Ativan, IV fluids with good improvement.  CT scan abdomen pelvis shows no acute processes.  Made aware of rectal polyp may be needs workup for that.  No signs of pneumonia on chest x-ray.  She had mild leukocytosis but otherwise no significant anemia.  Creatinine mostly at baseline.  Overall suspect viral process causing some nausea and vomiting.  She has had some diarrhea as well.  She is able to tolerate p.o.  I have no concern for cardiac or pulmonary process otherwise.  Discharged in good condition.  Understands return precautions.  She also had a head CT in triage that was unremarkable.  Overall suspect foodborne/viral process.  Discharged in good condition.  This chart was dictated using voice recognition software.  Despite best efforts to proofread,  errors can occur which can change the documentation meaning.         Final Clinical Impression(s) / ED Diagnoses Final diagnoses:  Vomiting without nausea, unspecified vomiting type    Rx / DC Orders ED Discharge Orders          Ordered    ondansetron (ZOFRAN-ODT) 4 MG disintegrating tablet  Every 8 hours PRN  12/02/22 1531              Virgina Norfolk, DO 12/02/22 1537

## 2022-12-02 NOTE — ED Provider Triage Note (Addendum)
Emergency Medicine Provider Triage Evaluation Note Level 5 caveat secondary to confusion Abigail Pearson , a 67 y.o. female  was evaluated in triage.  Pt complains of confusion Friend at bedside, Gabriel Rung, states she was normal yesterday but called him today complaining of confusion.  He reports that she does not drink alcohol. .  Medical record notes high blood pressure, diabetes, stroke, thyroid disease, renal disorder, COPD CAD with a pacemaker  Review of Systems  Positive: No complaints of pain Negative: Patient confused unable to cooperate with review of systems  Physical Exam  BP (!) 193/107 (BP Location: Right Arm)   Pulse (!) 104   Temp 99.2 F (37.3 C) (Axillary)   Resp (!) 22   SpO2 100%  Gen:   Awake, no distress patient appears generally unwell eyes partly closed Resp:  Normal effort some tachypnea MSK:   Moves extremities without difficulty  Other:  Patient is not oriented to year.  States she is in the waiting room.  [place] whether or not she is pregnant appears generally confused No palmar drift Able to move each of her legs up against gravity Pupils are dilated  Medical Decision Making  Medically screening exam initiated at 11:54 AM.  Appropriate orders placed.  Jasmane Brockway was informed that the remainder of the evaluation will be completed by another provider, this initial triage assessment does not replace that evaluation, and the importance of remaining in the ED until their evaluation is complete.  Patient with onset of confusion with reported within the past 24 hours She is hypertensive BS reported 259 Medication list does not include any blood thinners Medication list does include Xanax, Adderall, Lunesta, hydrocodone and oxycodone Plan CT labs Will need rectal temp RN informed the patient's need for bed placement now   Margarita Grizzle, MD 12/02/22 1158    Margarita Grizzle, MD 12/02/22 1159

## 2022-12-02 NOTE — ED Notes (Signed)
Pt noted to be rambling.  Sts she takes 27 medications per day and has only had 5 since Friday.  Sts she has not had a Xanax in several days either.

## 2022-12-02 NOTE — ED Notes (Signed)
Pt escorted to discharge window. Verbalized understanding discharge instructions. In no acute distress.   Pt sts her shirt is wet from spilling a water bottle on it.  Pt provided a paper scrub top.

## 2023-02-02 IMAGING — CT CT CTA ABD/PEL W/CM AND/OR W/O CM
2 of 8 series · 11 of 46 positions shown, 15 images · IV contrast (agent unspecified)
Comparison: CT abdomen pelvis 10/24/2020

CLINICAL DATA: 65-year-old with mesenteric ischemia. Acute GI
bleed.

EXAM:
CTA ABDOMEN AND PELVIS WITHOUT AND WITH CONTRAST
TECHNIQUE: Multidetector CT imaging of the abdomen and pelvis was performed
using the standard protocol during bolus administration of
intravenous contrast. Multiplanar reconstructed images and MIPs were
obtained and reviewed to evaluate the vascular anatomy.

[Series 6: coronals · coronal · 0.71mm/px · 2 of 100 slices shown]
[im 34/100  soft-tissue]
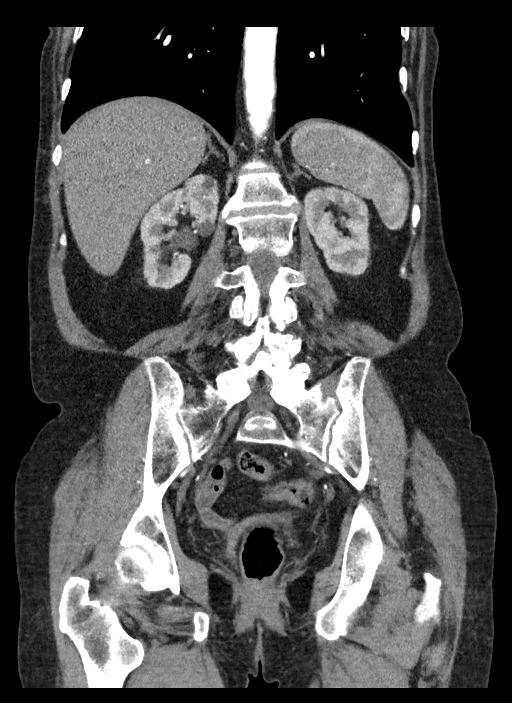
[im 67/100  soft-tissue]
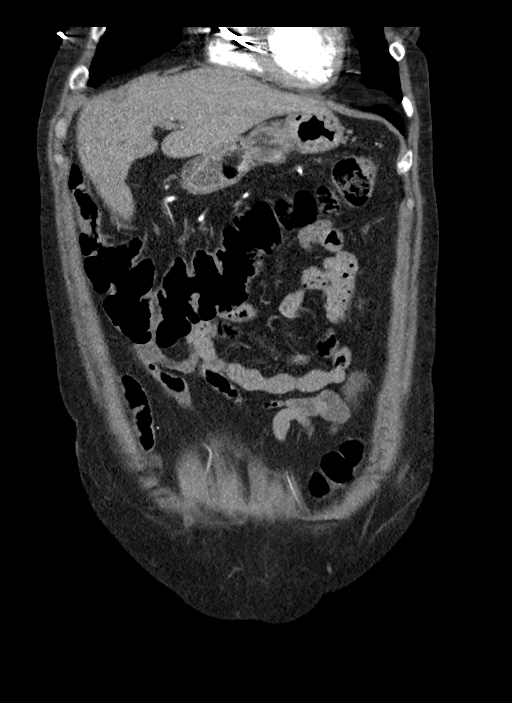

[Series 10: axial venous · axial · portal-venous · 0.74mm/px · z∈[-813,-448]mm · 9 of 91 slices shown, 13 images]
[im 9/91  soft-tissue]
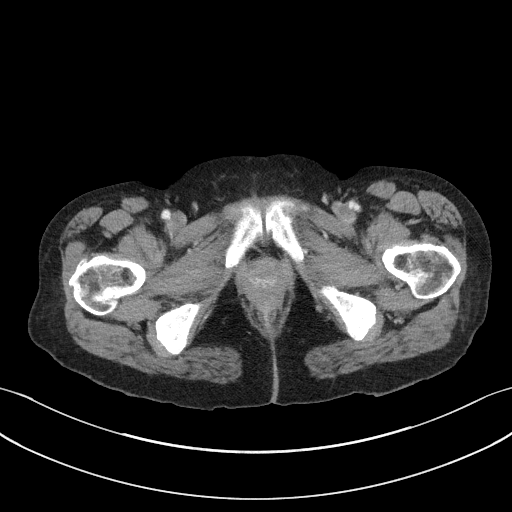
[im 9/91  bone]
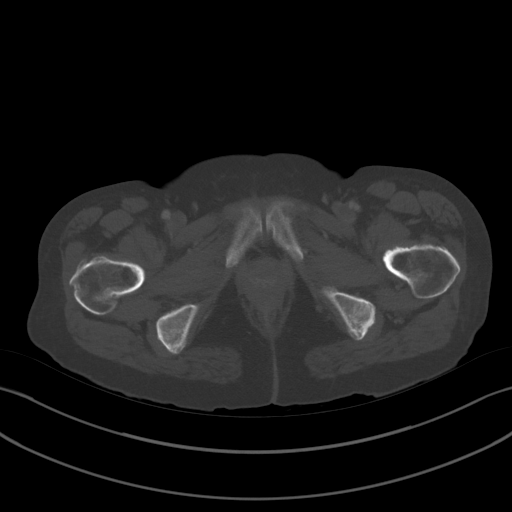
[im 17/91  soft-tissue]
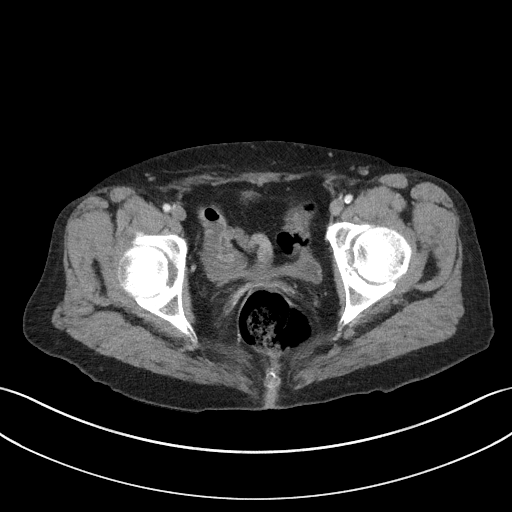
[im 33/91  soft-tissue]
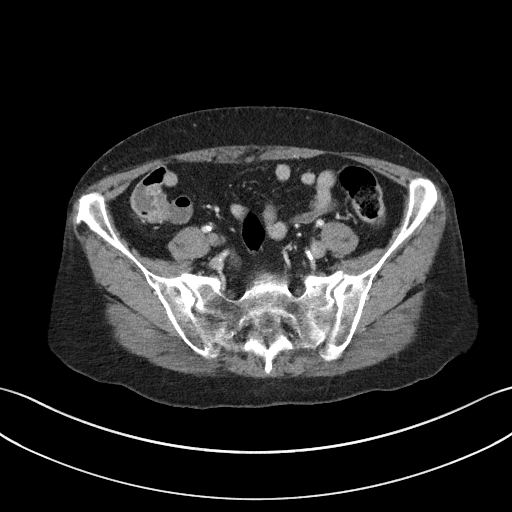
[im 41/91  soft-tissue]
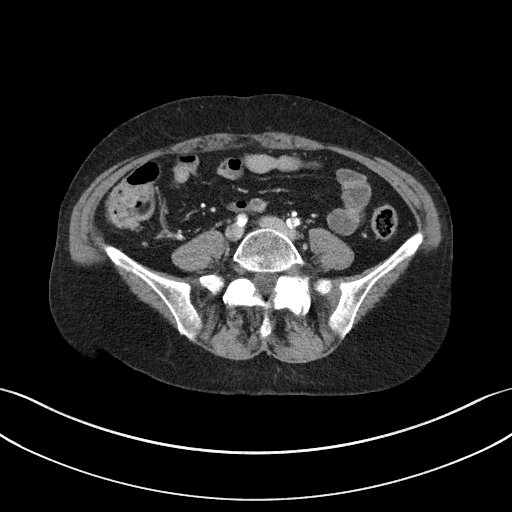
[im 50/91  soft-tissue]
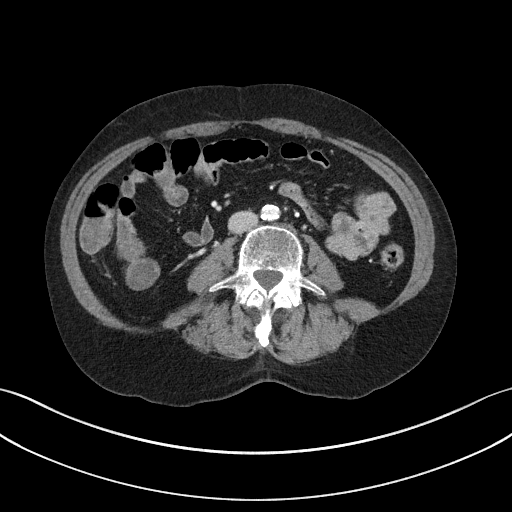
[im 58/91  soft-tissue]
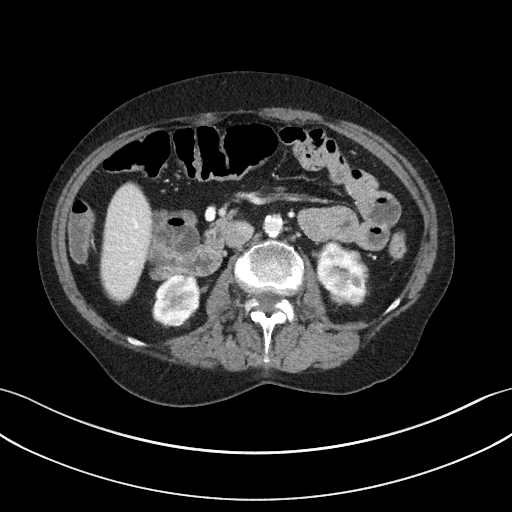
[im 58/91  lung]
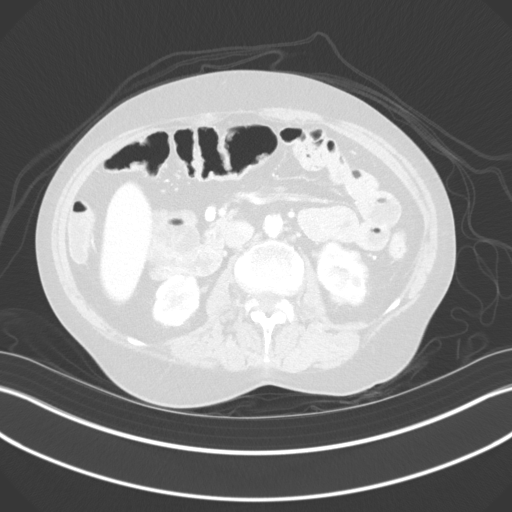
[im 66/91  lung]
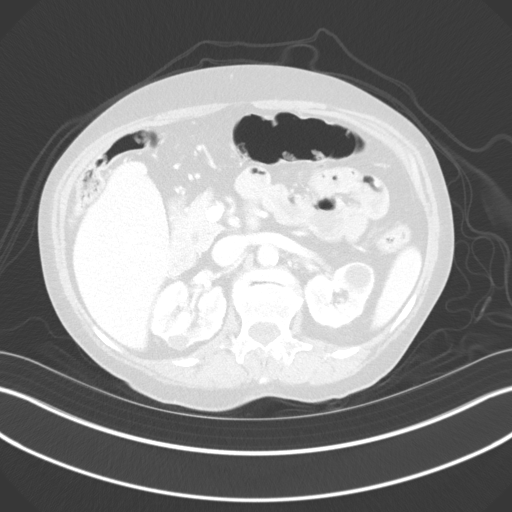
[im 74/91  soft-tissue]
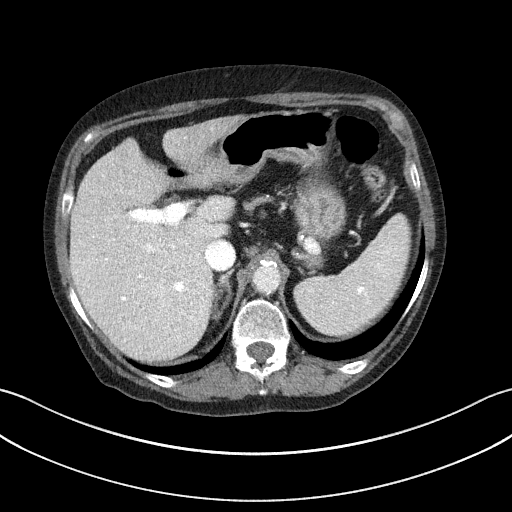
[im 74/91  lung]
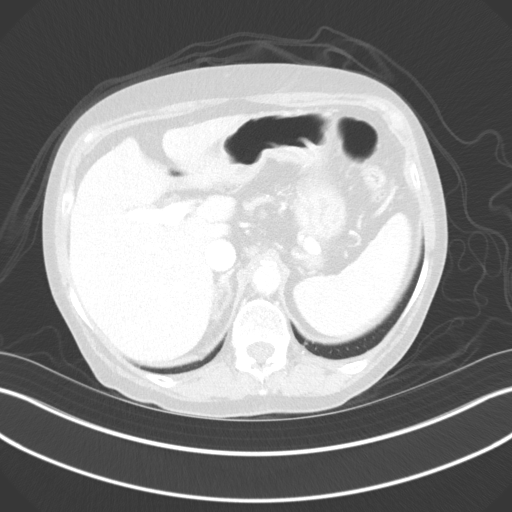
[im 82/91  soft-tissue]
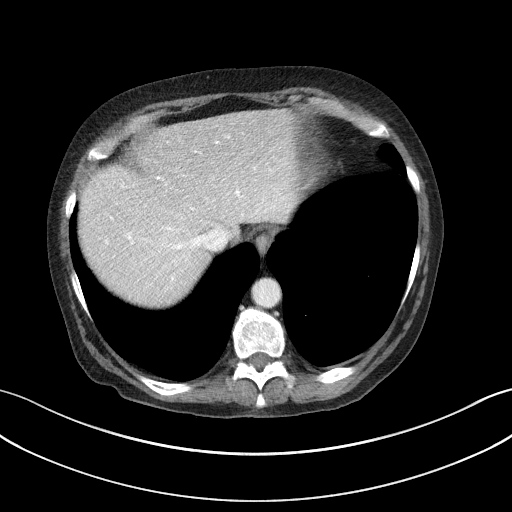
[im 82/91  lung]
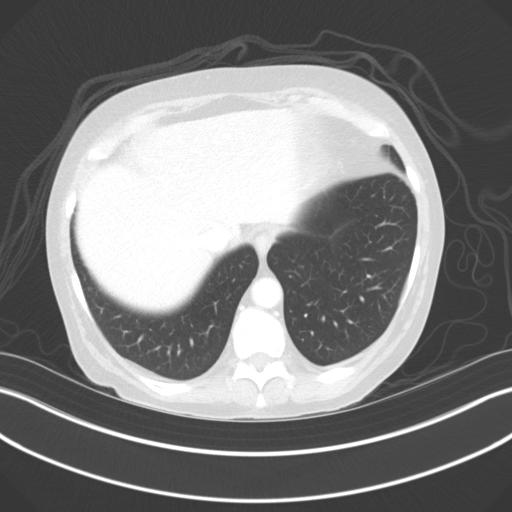

[11 of 46 positions shown; findings below may reference images not displayed]

RADIATION DOSE REDUCTION: This exam was performed according to the
departmental dose-optimization program which includes automated
exposure control, adjustment of the mA and/or kV according to
patient size and/or use of iterative reconstruction technique.

CONTRAST:  80mL OMNIPAQUE IOHEXOL 350 MG/ML SOLN
FINDINGS: VASCULAR

Aorta: Calcified plaque in the abdominal aorta without aneurysm,
dissection or significant stenosis.

Celiac: Patent without evidence of aneurysm, dissection, vasculitis
or significant stenosis.

SMA: Mild stenosis at the origin of the SMA. No evidence for
aneurysm or dissection.

Renals: Atherosclerotic plaque and likely hemodynamically
significant stenosis at the origin of the right renal artery. At
least mild stenosis at the origin of the left renal artery. No
definite renal artery aneurysms.

IMA: Small but patent.

Inflow: Diffuse atherosclerotic calcifications in the iliac
arteries. Common and external iliac arteries are patent without
high-grade stenosis. Evidence for stenosis at the origin of the
internal iliac arteries bilaterally.

Proximal Outflow: Proximal femoral arteries are patent bilaterally.

Veins: Portal venous system is patent. Renal veins are patent.
Limited evaluation of the iliac veins.

Review of the MIP images confirms the above findings.

NON-VASCULAR

Lower chest: Calcified granuloma at the left lung base measures 6
mm. No pleural effusions. Stable punctate peripheral nodule in the
right lower lobe on sequence 4 image 18. Cardiac ICD.

Hepatobiliary: Cholecystectomy. No acute liver abnormality. No
discrete liver lesion.

Pancreas: Unremarkable. No pancreatic ductal dilatation or
surrounding inflammatory changes.

Spleen: Spleen is normal in size.  Multiple splenic calcifications.

Adrenals/Urinary Tract: Normal adrenal glands. Bilateral renal
cysts. The cysts do not require dedicated follow-up. Kidneys are
slightly lobulated bilaterally. No suspicious renal lesions. No
hydronephrosis. Urinary bladder is decompressed.

Stomach/Bowel: Large calcification near the distal esophagus. Normal
appearance of the stomach. No bowel dilatation or obstruction. No
focal bowel inflammation. No evidence for active GI bleeding.

Lymphatic: No lymph node enlargement in the abdomen or pelvis.

Reproductive: Status post hysterectomy. No adnexal masses.

Other: Negative for ascites.  Negative for free air.

Musculoskeletal: No acute bone abnormality. Old fracture involving
the left inferior pubic ramus. Deformity along the L1 superior
endplate is chronic.
IMPRESSION: VASCULAR

1. Diffuse atherosclerotic disease in the abdominal aorta and iliac
arteries. No evidence for an aortic aneurysm.
2. Main mesenteric arteries are patent with at least mild narrowing
at the origin of the SMA and the IMA is small for size.
3. Stenosis involving bilateral renal arteries, right side more
severe than left.

NON-VASCULAR

1. No acute abnormality in the abdomen or pelvis. Specifically, no
evidence for active GI bleeding. No evidence for bowel inflammation.
2. Bilateral renal cysts. The cysts do not require dedicated
follow-up.
